# Patient Record
Sex: Male | Born: 1955
Health system: Southern US, Community
[De-identification: ages and names within clinical notes are randomized; demographics above are authoritative.]

## PROBLEM LIST (undated history)

## (undated) DIAGNOSIS — I10 Essential (primary) hypertension: Secondary | ICD-10-CM

## (undated) HISTORY — DX: Essential (primary) hypertension: I10

## (undated) HISTORY — PX: FRACTURE SURGERY: SHX138

---

## 1998-06-02 ENCOUNTER — Emergency Department (HOSPITAL_COMMUNITY): Admission: EM | Admit: 1998-06-02 | Discharge: 1998-06-02 | Payer: Self-pay | Admitting: Emergency Medicine

## 1998-08-19 ENCOUNTER — Emergency Department (HOSPITAL_COMMUNITY): Admission: EM | Admit: 1998-08-19 | Discharge: 1998-08-19 | Payer: Self-pay | Admitting: Emergency Medicine

## 1999-01-11 ENCOUNTER — Emergency Department (HOSPITAL_COMMUNITY): Admission: EM | Admit: 1999-01-11 | Discharge: 1999-01-11 | Payer: Self-pay | Admitting: Emergency Medicine

## 1999-01-11 ENCOUNTER — Encounter: Payer: Self-pay | Admitting: Emergency Medicine

## 1999-05-26 ENCOUNTER — Emergency Department (HOSPITAL_COMMUNITY): Admission: EM | Admit: 1999-05-26 | Discharge: 1999-05-26 | Payer: Self-pay | Admitting: Emergency Medicine

## 2000-10-22 ENCOUNTER — Emergency Department (HOSPITAL_COMMUNITY): Admission: EM | Admit: 2000-10-22 | Discharge: 2000-10-22 | Payer: Self-pay | Admitting: Emergency Medicine

## 2000-11-28 ENCOUNTER — Emergency Department (HOSPITAL_COMMUNITY): Admission: EM | Admit: 2000-11-28 | Discharge: 2000-11-28 | Payer: Self-pay | Admitting: *Deleted

## 2001-06-11 ENCOUNTER — Inpatient Hospital Stay (HOSPITAL_COMMUNITY): Admission: AC | Admit: 2001-06-11 | Discharge: 2001-06-25 | Payer: Self-pay

## 2004-04-20 ENCOUNTER — Emergency Department (HOSPITAL_COMMUNITY): Admission: EM | Admit: 2004-04-20 | Discharge: 2004-04-20 | Payer: Self-pay | Admitting: Emergency Medicine

## 2004-07-16 ENCOUNTER — Encounter: Admission: RE | Admit: 2004-07-16 | Discharge: 2004-07-16 | Payer: Self-pay | Admitting: Cardiology

## 2006-09-21 ENCOUNTER — Ambulatory Visit (HOSPITAL_BASED_OUTPATIENT_CLINIC_OR_DEPARTMENT_OTHER): Admission: RE | Admit: 2006-09-21 | Discharge: 2006-09-21 | Payer: Self-pay | Admitting: Cardiology

## 2006-09-25 ENCOUNTER — Ambulatory Visit: Payer: Self-pay | Admitting: Internal Medicine

## 2007-12-11 ENCOUNTER — Emergency Department (HOSPITAL_COMMUNITY): Admission: EM | Admit: 2007-12-11 | Discharge: 2007-12-11 | Payer: Self-pay | Admitting: Emergency Medicine

## 2011-02-18 ENCOUNTER — Emergency Department (HOSPITAL_COMMUNITY)
Admission: EM | Admit: 2011-02-18 | Discharge: 2011-02-19 | Disposition: A | Discharge status: Home / Self Care | Payer: 59 | Attending: Emergency Medicine | Admitting: Emergency Medicine

## 2011-02-18 ENCOUNTER — Emergency Department (HOSPITAL_COMMUNITY): Payer: 59

## 2011-02-18 DIAGNOSIS — R109 Unspecified abdominal pain: Secondary | ICD-10-CM | POA: Insufficient documentation

## 2011-02-18 DIAGNOSIS — M549 Dorsalgia, unspecified: Secondary | ICD-10-CM | POA: Insufficient documentation

## 2011-02-18 LAB — URINALYSIS, ROUTINE W REFLEX MICROSCOPIC
Bilirubin Urine: NEGATIVE
Glucose, UA: NEGATIVE mg/dL
Hgb urine dipstick: NEGATIVE
Ketones, ur: NEGATIVE mg/dL
Nitrite: NEGATIVE
Protein, ur: NEGATIVE mg/dL
Specific Gravity, Urine: 1.029 (ref 1.005–1.030)
Urobilinogen, UA: 0.2 mg/dL (ref 0.0–1.0)
pH: 5.5 (ref 5.0–8.0)

## 2011-02-18 LAB — DIFFERENTIAL
Basophils Absolute: 0.1 K/uL (ref 0.0–0.1)
Basophils Relative: 1 % (ref 0–1)
Eosinophils Absolute: 0.3 10*3/uL (ref 0.0–0.7)
Eosinophils Relative: 5 % (ref 0–5)
Lymphocytes Relative: 47 % — ABNORMAL HIGH (ref 12–46)
Lymphs Abs: 2.2 10*3/uL (ref 0.7–4.0)
Monocytes Absolute: 0.3 K/uL (ref 0.1–1.0)
Monocytes Relative: 6 % (ref 3–12)
Neutro Abs: 2.1 10*3/uL (ref 1.7–7.7)
Neutrophils Relative %: 41 % — ABNORMAL LOW (ref 43–77)

## 2011-02-18 LAB — CBC
HCT: 39.7 % (ref 39.0–52.0)
Hemoglobin: 12.3 g/dL — ABNORMAL LOW (ref 13.0–17.0)
MCH: 24.3 pg — ABNORMAL LOW (ref 26.0–34.0)
MCHC: 31 g/dL (ref 30.0–36.0)
MCV: 78.3 fL (ref 78.0–100.0)
RBC: 5.07 MIL/uL (ref 4.22–5.81)
RDW: 14.2 % (ref 11.5–15.5)
WBC: 5 K/uL (ref 4.0–10.5)

## 2011-02-18 LAB — BASIC METABOLIC PANEL
CO2: 23 mEq/L (ref 19–32)
Calcium: 9.4 mg/dL (ref 8.4–10.5)
Chloride: 105 mEq/L (ref 96–112)
Creatinine, Ser: 0.73 mg/dL (ref 0.4–1.5)
GFR calc non Af Amer: >60 mL/min (ref >60)
Glucose, Bld: 102 mg/dL — ABNORMAL HIGH (ref 70–99)
Potassium: 4.6 mEq/L (ref 3.5–5.1)
Sodium: 139 mEq/L (ref 135–145)

## 2011-02-18 LAB — BASIC METABOLIC PANEL WITH GFR
BUN: 15 mg/dL (ref 6–23)
GFR calc Af Amer: >60 mL/min (ref >60)
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
## 2011-04-09 NOTE — Procedures (Signed)
NAME:  ESTER, MABE NO.:  [id number]   MEDICAL RECORD NO.:  [id number]          PATIENT TYPE:  OUT   LOCATION:  SLEEP CENTER                 FACILITY:  [facility]   PHYSICIAN:  Clinton D. Maple Hudson, MD, FCCP, FACPDATE OF BIRTH:  01/18/58   DATE OF STUDY:  09/21/2006                              NOCTURNAL POLYSOMNOGRAM   INDICATION FOR STUDY:  Hypersomnia with sleep apnea.   EPWORTH SLEEPINESS SCORE:  13/24; BMI 29.8; weight 186 pounds.   MEDICATIONS:  Home medication limited to Lamisil.   SLEEP ARCHITECTURE:  Total sleep time 342 minutes with sleep efficiency 73%.  Stage I was 12%, stage II 82%, stages III and IV absent.  REM 6% of total  sleep time.  Sleep latency was 30 minutes, REM latency 109 minutes.  Awake  after sleep onset 96%.  Arousal index 15.1.  No bedtime medication was  taken.   RESPIRATORY DATA:  Apnea-hypopnea index (AHI, RDI):  Seventeen obstructive  events per hour, indicating moderate obstructive sleep apnea/hypopnea  syndrome.  There were 55 obstructive apneas and 42 hypopneas.  Events were  not positional.  REM AHI 75.3 per hour.  There were insufficient events  early in the night to qualify for CPAP titration by split study protocol on  this study night.   OXYGEN DATA:  Mild to moderate snoring with oxygen desaturation to a nadir  of 85%.  Mean oxygen saturation through the study was 94% on room air.   CARDIAC DATA:  Normal sinus rhythm.   MOVEMENT/PARASOMNIA:  Occasional limb jerk with arousal, insignificant.  No  bathroom trips.   IMPRESSIONS/RECOMMENDATIONS:  1. Moderate obstructive sleep apnea/hypopnea syndrome, apnea-hypopnea      index 17 per hour with non-positional events, mild to moderate snoring      and oxygen desaturation to a nadir of 85%.  2. Consider return for continuous positive airway pressure titration if      more conservative measures are insufficient; otherwise, consider      alternative therapies as  appropriate.      Clinton D. Maple Hudson, MD, [facility], FACP  Diplomate, Biomedical engineer of Sleep Medicine  Electronically Signed    CDY/MEDQ  D:  09/25/2006 10:19:11  T:  09/26/2006 07:49:22  Job:  [id number]
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
## 2011-04-09 NOTE — Op Note (Signed)
[place]. [facility]  Patient:    Matthew Hill, Matthew Hill                     MRN: [id number] Proc. Date: 06/19/01 Adm. Date:  [id number] Disc. Date: [id number] Attending:  Cain Sieve                           Operative Report  PREOPERATIVE DIAGNOSIS:  Full-thickness skin defect left popliteal fossa.  POSTOPERATIVE DIAGNOSIS:  Full-thickness skin defect left popliteal fossa.  PROCEDURE:  Debridement and split-thickness skin graft to left leg wound from left thigh.  SURGEON:  Dineen Kid. Best, M.D.  ANESTHESIA:  General.  COMPLICATIONS:  None.  DRAINS:  None.  TECHNIQUE:  The patient was positioned supine on the operating table and general endotracheal anesthesia was obtained.  A sterile prep and drape was performed.  The area of the anterior thigh was draped for a skin graft donor site.  The Zimmer mesher was used to remove a split-thickness skin graft from this position with a thickness setting of approximately 13 to 15 thousandths. The donor site was initially covered with Xylocaine with epinephrine soaked gauze sponge.  This was removed and a large piece of OpSite placed over the donor site area.  The patient was then turned to the right lateral decubitus position and prep and drape of the left posterior popliteal fossa of the knee was performed.  The graft was placed through the Zimmer mesher at a mesh ratio of 6:1 to expand it.  This was then expanded and placed over the skin defect and secured with multiple peripheral sutures of 5-0 chromic.  Xeroform gauze was placed over the graft and fluffed saline gauze placed over this and secured with an Ace wrap and Kerlix also.  The patient tolerated the procedure well and was sent to the recovery area in stable condition. DD:  06/19/01 TD:  06/19/01 Job: [id number]

## 2011-08-12 LAB — INFLUENZA A+B VIRUS AG-DIRECT(RAPID)

## 2011-08-12 LAB — RAPID STREP SCREEN (MED CTR MEBANE ONLY): Streptococcus, Group A Screen (Direct): NEGATIVE
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
## 2017-07-14 ENCOUNTER — Ambulatory Visit (INDEPENDENT_AMBULATORY_CARE_PROVIDER_SITE_OTHER): Payer: BLUE CROSS/BLUE SHIELD | Admitting: Physician Assistant

## 2017-07-14 ENCOUNTER — Encounter: Payer: Self-pay | Admitting: Physician Assistant

## 2017-07-14 VITALS — BP 142/94 | HR 84 | Temp 98.8°F | Resp 18 | Ht 67.24 in | Wt 195.4 lb

## 2017-07-14 DIAGNOSIS — I10 Essential (primary) hypertension: Secondary | ICD-10-CM | POA: Insufficient documentation

## 2017-07-14 MED ORDER — AMLODIPINE BESYLATE 5 MG PO TABS: 5.0000 mg | ORAL_TABLET | Freq: Every day | ORAL | 1 refills | Status: DC

## 2017-07-14 NOTE — Progress Notes (Signed)
PRIMARY CARE AT [facility] [address] [place] [address] [phone]  Date:  07/14/2017   Name:  Matthew Hill   DOB:  22-Dec-1955   MRN:  [id number]  PCP:  Patient, No Pcp Per    History of Present Illness:  Matthew Hill is a 61 y.o. male patient who presents to PCP with  Chief Complaint  Patient presents with  . Hypertension     No chest pains, palpitations, or sob.  He works out 3 times per week.  No salt in foods.  He has no primary care physician at this time.   Family hx: High blood pressure with mgm, mother, and sister.   No dizziness.   He has fried foods which he cooks with grapeseed oil or olive oil.   He does not wake up with gasping for air.    Wt Readings from Last 3 Encounters:  07/14/17 195 lb 6.4 oz (88.6 kg)     There are no active problems to display for this patient.   History reviewed. No pertinent past medical history.  History reviewed. No pertinent surgical history.  Social History  Substance Use Topics  . Smoking status: Never Smoker  . Smokeless tobacco: Never Used  . Alcohol use Yes    Family History  Problem Relation Age of Onset  . Stroke Mother   . Stroke Maternal Grandmother     No Known Allergies  Medication list has been reviewed and updated.  No current outpatient prescriptions on file prior to visit.   No current facility-administered medications on file prior to visit.     ROS ROS otherwise unremarkable unless listed above.  Physical Examination: BP (!) 142/94 (BP Location: Right Arm, Patient Position: Sitting, Cuff Size: Large)   Pulse 84   Temp 98.8 F (37.1 C) (Oral)   Resp 18   Ht 5' 7.24" (1.708 m)   Wt 195 lb 6.4 oz (88.6 kg)   SpO2 96%   BMI 30.38 kg/m  Ideal Body Weight: Weight in (lb) to have BMI = 25: 160.4  Physical Exam  Constitutional: He is oriented to person, place, and time. He appears well-developed and well-nourished. No distress.  HENT:  Head: Normocephalic and atraumatic.   Eyes: Pupils are equal, round, and reactive to light. Conjunctivae and EOM are normal.  Cardiovascular: Normal rate, regular rhythm, normal heart sounds and intact distal pulses.  Exam reveals no friction rub.   No murmur heard. Pulmonary/Chest: Effort normal and breath sounds normal. No respiratory distress. He has no wheezes.  Neurological: He is alert and oriented to person, place, and time.  Skin: Skin is warm and dry. He is not diaphoretic.  Psychiatric: He has a normal mood and affect. His behavior is normal.     Assessment and Plan: Matthew Hill is a 61 y.o. male who is here today for  Chief Complaint  Patient presents with  . Hypertension   Essential hypertension - Plan: amLODipine (NORVASC) 5 MG tablet, Basic metabolic panel  Trena Platt, PA-C [facility] 9/1/201810:06 AM

## 2017-07-14 NOTE — Patient Instructions (Addendum)
 DASH Eating Plan DASH stands for "Dietary Approaches to Stop Hypertension." The DASH eating plan is a healthy eating plan that has been shown to reduce high blood pressure (hypertension). It may also reduce your risk for type 2 diabetes, heart disease, and stroke. The DASH eating plan may also help with weight loss. What are tips for following this plan? General guidelines  Avoid eating more than 2,300 mg (milligrams) of salt (sodium) a day. If you have hypertension, you may need to reduce your sodium intake to 1,500 mg a day.  Limit alcohol intake to no more than 1 drink a day for nonpregnant women and 2 drinks a day for men. One drink equals 12 oz of beer, 5 oz of wine, or 1 oz of hard liquor.  Work with your health care provider to maintain a healthy body weight or to lose weight. Ask what an ideal weight is for you.  Get at least 30 minutes of exercise that causes your heart to beat faster (aerobic exercise) most days of the week. Activities may include walking, swimming, or biking.  Work with your health care provider or diet and nutrition specialist (dietitian) to adjust your eating plan to your individual calorie needs. Reading food labels  Check food labels for the amount of sodium per serving. Choose foods with less than 5 percent of the Daily Value of sodium. Generally, foods with less than 300 mg of sodium per serving fit into this eating plan.  To find whole grains, look for the word "whole" as the first word in the ingredient list. Shopping  Buy products labeled as "low-sodium" or "no salt added."  Buy fresh foods. Avoid canned foods and premade or frozen meals. Cooking  Avoid adding salt when cooking. Use salt-free seasonings or herbs instead of table salt or sea salt. Check with your health care provider or pharmacist before using salt substitutes.  Do not fry foods. Cook foods using healthy methods such as baking, boiling, grilling, and broiling instead.  Cook with  heart-healthy oils, such as olive, canola, soybean, or sunflower oil. Meal planning   Eat a balanced diet that includes: ? 5 or more servings of fruits and vegetables each day. At each meal, try to fill half of your plate with fruits and vegetables. ? Up to 6-8 servings of whole grains each day. ? Less than 6 oz of lean meat, poultry, or fish each day. A 3-oz serving of meat is about the same size as a deck of cards. One egg equals 1 oz. ? 2 servings of low-fat dairy each day. ? A serving of nuts, seeds, or beans 5 times each week. ? Heart-healthy fats. Healthy fats called Omega-3 fatty acids are found in foods such as flaxseeds and coldwater fish, like sardines, salmon, and mackerel.  Limit how much you eat of the following: ? Canned or prepackaged foods. ? Food that is high in trans fat, such as fried foods. ? Food that is high in saturated fat, such as fatty meat. ? Sweets, desserts, sugary drinks, and other foods with added sugar. ? Full-fat dairy products.  Do not salt foods before eating.  Try to eat at least 2 vegetarian meals each week.  Eat more home-cooked food and less restaurant, buffet, and fast food.  When eating at a restaurant, ask that your food be prepared with less salt or no salt, if possible. What foods are recommended? The items listed may not be a complete list. Talk with your dietitian about   what dietary choices are best for you. Grains Whole-grain or whole-wheat bread. Whole-grain or whole-wheat pasta. Brown rice. Oatmeal. Quinoa. Bulgur. Whole-grain and low-sodium cereals. Pita bread. Low-fat, low-sodium crackers. Whole-wheat flour tortillas. Vegetables Fresh or frozen vegetables (raw, steamed, roasted, or grilled). Low-sodium or reduced-sodium tomato and vegetable juice. Low-sodium or reduced-sodium tomato sauce and tomato paste. Low-sodium or reduced-sodium canned vegetables. Fruits All fresh, dried, or frozen fruit. Canned fruit in natural juice (without  added sugar). Meat and other protein foods Skinless chicken or turkey. Ground chicken or turkey. Pork with fat trimmed off. Fish and seafood. Egg whites. Dried beans, peas, or lentils. Unsalted nuts, nut butters, and seeds. Unsalted canned beans. Lean cuts of beef with fat trimmed off. Low-sodium, lean deli meat. Dairy Low-fat (1%) or fat-free (skim) milk. Fat-free, low-fat, or reduced-fat cheeses. Nonfat, low-sodium ricotta or cottage cheese. Low-fat or nonfat yogurt. Low-fat, low-sodium cheese. Fats and oils Soft margarine without trans fats. Vegetable oil. Low-fat, reduced-fat, or light mayonnaise and salad dressings (reduced-sodium). Canola, safflower, olive, soybean, and sunflower oils. Avocado. Seasoning and other foods Herbs. Spices. Seasoning mixes without salt. Unsalted popcorn and pretzels. Fat-free sweets. What foods are not recommended? The items listed may not be a complete list. Talk with your dietitian about what dietary choices are best for you. Grains Baked goods made with fat, such as croissants, muffins, or some breads. Dry pasta or rice meal packs. Vegetables Creamed or fried vegetables. Vegetables in a cheese sauce. Regular canned vegetables (not low-sodium or reduced-sodium). Regular canned tomato sauce and paste (not low-sodium or reduced-sodium). Regular tomato and vegetable juice (not low-sodium or reduced-sodium). Pickles. Olives. Fruits Canned fruit in a light or heavy syrup. Fried fruit. Fruit in cream or butter sauce. Meat and other protein foods Fatty cuts of meat. Ribs. Fried meat. Bacon. Sausage. Bologna and other processed lunch meats. Salami. Fatback. Hotdogs. Bratwurst. Salted nuts and seeds. Canned beans with added salt. Canned or smoked fish. Whole eggs or egg yolks. Chicken or turkey with skin. Dairy Whole or 2% milk, cream, and half-and-half. Whole or full-fat cream cheese. Whole-fat or sweetened yogurt. Full-fat cheese. Nondairy creamers. Whipped toppings.  Processed cheese and cheese spreads. Fats and oils Butter. Stick margarine. Lard. Shortening. Ghee. Bacon fat. Tropical oils, such as coconut, palm kernel, or palm oil. Seasoning and other foods Salted popcorn and pretzels. Onion salt, garlic salt, seasoned salt, table salt, and sea salt. Worcestershire sauce. Tartar sauce. Barbecue sauce. Teriyaki sauce. Soy sauce, including reduced-sodium. Steak sauce. Canned and packaged gravies. Fish sauce. Oyster sauce. Cocktail sauce. Horseradish that you find on the shelf. Ketchup. Mustard. Meat flavorings and tenderizers. Bouillon cubes. Hot sauce and Tabasco sauce. Premade or packaged marinades. Premade or packaged taco seasonings. Relishes. Regular salad dressings. Where to find more information:  National Heart, Lung, and Blood Institute: www.nhlbi.nih.gov  American Heart Association: www.heart.org Summary  The DASH eating plan is a healthy eating plan that has been shown to reduce high blood pressure (hypertension). It may also reduce your risk for type 2 diabetes, heart disease, and stroke.  With the DASH eating plan, you should limit salt (sodium) intake to 2,300 mg a day. If you have hypertension, you may need to reduce your sodium intake to 1,500 mg a day.  When on the DASH eating plan, aim to eat more fresh fruits and vegetables, whole grains, lean proteins, low-fat dairy, and heart-healthy fats.  Work with your health care provider or diet and nutrition specialist (dietitian) to adjust your eating plan to your   individual calorie needs. This information is not intended to replace advice given to you by your health care provider. Make sure you discuss any questions you have with your health care provider. Document Released: 10/28/2011 Document Revised: 11/01/2016 Document Reviewed: 11/01/2016 Elsevier Interactive Patient Education  2017 Elsevier Inc.    IF you received an x-ray today, you will receive an invoice from [facility].  Please contact [facility] at [phone] with questions or concerns regarding your invoice.   IF you received labwork today, you will receive an invoice from [facility]. Please contact [facility] at [phone] with questions or concerns regarding your invoice.   Our billing staff will not be able to assist you with questions regarding bills from these companies.  You will be contacted with the lab results as soon as they are available. The fastest way to get your results is to activate your My Chart account. Instructions are located on the last page of this paperwork. If you have not heard from us regarding the results in 2 weeks, please contact this office.     

## 2017-07-15 LAB — BASIC METABOLIC PANEL
BUN/Creatinine Ratio: 15 (ref 9–20)
BUN: 18 mg/dL (ref 6–24)
CALCIUM: 9.8 mg/dL (ref 8.7–10.2)
CO2: 20 mmol/L (ref 20–29)
CREATININE: 1.22 mg/dL (ref 0.76–1.27)
Chloride: 101 mmol/L (ref 96–106)
GFR, EST AFRICAN AMERICAN: 75 mL/min/{1.73_m2} (ref >59)
GFR, EST NON AFRICAN AMERICAN: 64 mL/min/{1.73_m2} (ref >59)
Glucose: 100 mg/dL — ABNORMAL HIGH (ref 65–99)
Potassium: 4.6 mmol/L (ref 3.5–5.2)
Sodium: 140 mmol/L (ref 134–144)

## 2017-08-01 ENCOUNTER — Encounter: Payer: Self-pay | Admitting: Physician Assistant

## 2017-08-01 ENCOUNTER — Ambulatory Visit (INDEPENDENT_AMBULATORY_CARE_PROVIDER_SITE_OTHER): Payer: BLUE CROSS/BLUE SHIELD | Admitting: Physician Assistant

## 2017-08-01 ENCOUNTER — Ambulatory Visit: Payer: BLUE CROSS/BLUE SHIELD | Admitting: Physician Assistant

## 2017-08-01 VITALS — BP 128/98 | HR 74 | Temp 97.9°F | Resp 18 | Ht 67.24 in | Wt 197.0 lb

## 2017-08-01 DIAGNOSIS — I10 Essential (primary) hypertension: Secondary | ICD-10-CM | POA: Diagnosis not present

## 2017-08-01 NOTE — Progress Notes (Signed)
[facility] [address], [place] [id number] [phone]  Date:  08/01/2017   Name:  Matthew Hill   DOB:  [birth year]   MRN:  [id number]  PCP:  Patient, No Pcp Per    History of Present Illness:  Matthew Hill is a 61 y.o. male patient who presents to PCP with  Chief Complaint  Patient presents with  . Hypertension    follow up      Patient continues to participate in the same exercise that he has been doing at this time.   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    There are no active problems to display for this patient.   No past medical history on file.  No past surgical history on file.  Social History  Substance Use Topics  . Smoking status: Never Smoker  . Smokeless tobacco: Never Used  . Alcohol use Yes    Family History  Problem Relation Age of Onset  . Stroke Mother   . Stroke Maternal Grandmother     No Known Allergies  Medication list has been reviewed and updated.  Current Outpatient Prescriptions on File Prior to Visit  Medication Sig Dispense Refill  . amLODipine (NORVASC) 5 MG tablet Take 1 tablet (5 mg total) by mouth daily. 30 tablet 1   No current facility-administered medications on file prior to visit.     ROS ROS otherwise unremarkable unless listed above.  Physical Examination: BP (!) 128/98   Pulse 74   Temp 97.9 F (36.6 C) (Oral)   Resp 18   Ht 5' 7.24" (1.708 m)   Wt 197 lb (89.4 kg)   SpO2 98%   BMI 30.63 kg/m  Ideal Body Weight: Weight in (lb) to have BMI = 25: 160.4  Physical Exam  Constitutional: He is oriented to person, place, and time. He appears well-developed and well-nourished. No distress.  HENT:  Head: Normocephalic and atraumatic.  Eyes: Pupils are equal, round, and reactive to light. Conjunctivae and EOM are normal.  Cardiovascular: Normal rate, regular rhythm and normal  heart sounds.  Exam reveals no friction rub.   No murmur heard. Pulmonary/Chest: Effort normal. No respiratory distress.  Musculoskeletal: He exhibits no edema.  Neurological: He is alert and oriented to person, place, and time.  Skin: Skin is warm and dry. He is not diaphoretic.  Psychiatric: He has a normal mood and affect. His behavior is normal.     Assessment and Plan: Matthew Hill is a 61 y.o. male who is here today for follow up of BP. This is improving. I am having him return in 1-2 months for full physical exam where we can update health maintenance and screenings.  He appears willing and wanting to have this. Essential hypertension  Trena PlattStephanie English, PA-C [facility] 9/12/20184:27 PM

## 2017-08-01 NOTE — Patient Instructions (Signed)
     IF you received an x-ray today, you will receive an invoice from [facility]. Please contact [facility] at [phone] with questions or concerns regarding your invoice.   IF you received labwork today, you will receive an invoice from [facility]. Please contact [facility] at [phone] with questions or concerns regarding your invoice.   Our billing staff will not be able to assist you with questions regarding bills from these companies.  You will be contacted with the lab results as soon as they are available. The fastest way to get your results is to activate your My Chart account. Instructions are located on the last page of this paperwork. If you have not heard from us regarding the results in 2 weeks, please contact this office.     

## 2017-08-08 ENCOUNTER — Encounter: Payer: Self-pay | Admitting: *Deleted

## 2017-10-03 ENCOUNTER — Ambulatory Visit: Payer: BLUE CROSS/BLUE SHIELD | Admitting: Physician Assistant

## 2018-02-20 ENCOUNTER — Encounter: Payer: Self-pay | Admitting: Physician Assistant

## 2019-09-10 ENCOUNTER — Encounter: Payer: Self-pay | Admitting: Registered Nurse

## 2019-09-10 ENCOUNTER — Encounter: Payer: BLUE CROSS/BLUE SHIELD | Admitting: Registered Nurse

## 2019-09-11 ENCOUNTER — Encounter: Payer: Self-pay | Admitting: Registered Nurse

## 2019-09-11 ENCOUNTER — Ambulatory Visit (INDEPENDENT_AMBULATORY_CARE_PROVIDER_SITE_OTHER): Payer: 59 | Admitting: Registered Nurse

## 2019-09-11 ENCOUNTER — Other Ambulatory Visit: Payer: Self-pay

## 2019-09-11 VITALS — BP 142/100 | HR 59 | Temp 97.6°F | Resp 16 | Ht 67.52 in | Wt 187.0 lb

## 2019-09-11 DIAGNOSIS — Z7689 Persons encountering health services in other specified circumstances: Secondary | ICD-10-CM | POA: Diagnosis not present

## 2019-09-11 DIAGNOSIS — Z13228 Encounter for screening for other metabolic disorders: Secondary | ICD-10-CM

## 2019-09-11 DIAGNOSIS — I10 Essential (primary) hypertension: Secondary | ICD-10-CM

## 2019-09-11 DIAGNOSIS — Z13220 Encounter for screening for lipoid disorders: Secondary | ICD-10-CM

## 2019-09-11 DIAGNOSIS — Z1329 Encounter for screening for other suspected endocrine disorder: Secondary | ICD-10-CM

## 2019-09-11 DIAGNOSIS — Z130 Encounter for screening for diseases of the blood and blood-forming organs and certain disorders involving the immune mechanism: Secondary | ICD-10-CM

## 2019-09-11 DIAGNOSIS — K439 Ventral hernia without obstruction or gangrene: Secondary | ICD-10-CM

## 2019-09-11 MED ORDER — AMLODIPINE BESYLATE 5 MG PO TABS: 5.0000 mg | ORAL_TABLET | Freq: Every day | ORAL | 3 refills | Status: AC

## 2019-09-11 NOTE — Patient Instructions (Signed)
° ° ° °  If you have lab work done today you will be contacted with your lab results within the next 2 weeks.  If you have not heard from us then please contact us. The fastest way to get your results is to register for My Chart. ° ° °IF you received an x-ray today, you will receive an invoice from [facility]. Please contact [facility] at [phone] with questions or concerns regarding your invoice.  ° °IF you received labwork today, you will receive an invoice from [facility]. Please contact [facility] at [phone] with questions or concerns regarding your invoice.  ° °Our billing staff will not be able to assist you with questions regarding bills from these companies. ° °You will be contacted with the lab results as soon as they are available. The fastest way to get your results is to activate your My Chart account. Instructions are located on the last page of this paperwork. If you have not heard from us regarding the results in 2 weeks, please contact this office. °  ° ° ° °

## 2019-09-12 LAB — CBC WITH DIFFERENTIAL/PLATELET
Basophils Absolute: 0 10*3/uL (ref 0.0–0.2)
Basos: 1 %
EOS (ABSOLUTE): 0.3 10*3/uL (ref 0.0–0.4)
Eos: 4 %
Hematocrit: 44.6 % (ref 37.5–51.0)
Hemoglobin: 14 g/dL (ref 13.0–17.7)
Immature Grans (Abs): 0 10*3/uL (ref 0.0–0.1)
Immature Granulocytes: 0 %
Lymphocytes Absolute: 2.4 10*3/uL (ref 0.7–3.1)
Lymphs: 29 %
MCH: 23.8 pg — ABNORMAL LOW (ref 26.6–33.0)
MCHC: 31.4 g/dL — ABNORMAL LOW (ref 31.5–35.7)
MCV: 76 fL — ABNORMAL LOW (ref 79–97)
Monocytes Absolute: 0.6 10*3/uL (ref 0.1–0.9)
Monocytes: 7 %
Neutrophils Absolute: 4.8 10*3/uL (ref 1.4–7.0)
Neutrophils: 59 %
Platelets: 316 10*3/uL (ref 150–450)
RBC: 5.89 x10E6/uL — ABNORMAL HIGH (ref 4.14–5.80)
RDW: 15.6 % — ABNORMAL HIGH (ref 11.6–15.4)
WBC: 8.1 10*3/uL (ref 3.4–10.8)

## 2019-09-12 LAB — COMPREHENSIVE METABOLIC PANEL
ALT: 24 IU/L (ref 0–44)
AST: 25 IU/L (ref 0–40)
Albumin/Globulin Ratio: 1.8 (ref 1.2–2.2)
Albumin: 4.8 g/dL (ref 3.8–4.8)
Alkaline Phosphatase: 74 IU/L (ref 39–117)
BUN/Creatinine Ratio: 18 (ref 10–24)
BUN: 17 mg/dL (ref 8–27)
Bilirubin Total: <0.2 mg/dL (ref 0.0–1.2)
CO2: 23 mmol/L (ref 20–29)
Calcium: 10.1 mg/dL (ref 8.6–10.2)
Chloride: 107 mmol/L — ABNORMAL HIGH (ref 96–106)
Creatinine, Ser: 0.92 mg/dL (ref 0.76–1.27)
GFR calc Af Amer: 103 mL/min/{1.73_m2} (ref >59)
GFR calc non Af Amer: 89 mL/min/{1.73_m2} (ref >59)
Globulin, Total: 2.6 g/dL (ref 1.5–4.5)
Glucose: 104 mg/dL — ABNORMAL HIGH (ref 65–99)
Potassium: 4.6 mmol/L (ref 3.5–5.2)
Sodium: 144 mmol/L (ref 134–144)
Total Protein: 7.4 g/dL (ref 6.0–8.5)

## 2019-09-12 LAB — HEMOGLOBIN A1C
Est. average glucose Bld gHb Est-mCnc: 126 mg/dL
Hgb A1c MFr Bld: 6 % — ABNORMAL HIGH (ref 4.8–5.6)

## 2019-09-12 LAB — LIPID PANEL
Chol/HDL Ratio: 4.1 ratio (ref 0.0–5.0)
Cholesterol, Total: 209 mg/dL — ABNORMAL HIGH (ref 100–199)
HDL: 51 mg/dL (ref >39)
LDL Chol Calc (NIH): 124 mg/dL — ABNORMAL HIGH (ref 0–99)
Triglycerides: 193 mg/dL — ABNORMAL HIGH (ref 0–149)
VLDL Cholesterol Cal: 34 mg/dL (ref 5–40)

## 2019-09-12 LAB — TSH: TSH: 1.62 u[IU]/mL (ref 0.450–4.500)

## 2019-09-13 ENCOUNTER — Encounter: Payer: Self-pay | Admitting: Registered Nurse

## 2019-09-13 NOTE — Progress Notes (Signed)
Established Patient Office Visit  Subjective:  Patient ID: Matthew Hill, male    DOB: 12/25/1955  Age: 63 y.o. MRN: [id number]  CC:  Chief Complaint  Patient presents with  . Breast Mass    pt states he has had this mass on his stomach for 2 months   . Hypertension    toc to manage medication     HPI Matthew Hill presents for visit to establish care and concern for abdominal mass. Given the relative simplicity of his medical history, we spent much of this visit focused on his abdominal mass.   Mass onset around 2 months ago. Has not changed much. Not painful or tender. Denies constipation, diarrhea, nausea, vomiting, abdominal pain, fever, unexpected weight changes. He states he is still passing stool and gas very regularly. States his diet is largely plant based.  Otherwise, he has a hx of HTN for which he takes amlodipine 5mg  PO qd with good effect. He has run out, and has high BP today. Will return to clinic in 2 weeks for nurse visit BP check. If still high, we will double dose to 10mg  PO qd.   Past Medical History:  Diagnosis Date  . Essential hypertension     History reviewed. No pertinent surgical history.  Family History  Problem Relation Age of Onset  . Stroke Mother   . Stroke Father   . Heart attack Father   . Stroke Maternal Grandmother   . Stroke Sister   . Stroke Brother     Social History   Socioeconomic History  . Marital status: Single    Spouse name: Not on file  . Number of children: Not on file  . Years of education: Not on file  . Highest education level: Not on file  Occupational History  . Not on file  Social Needs  . Financial resource strain: Not hard at all  . Food insecurity    Worry: Never true    Inability: Never true  . Transportation needs    Medical: No    Non-medical: No  Tobacco Use  . Smoking status: Never Smoker  . Smokeless tobacco: Never Used  Substance and Sexual Activity  . Alcohol use: Yes  . Drug use: No   . Sexual activity: Not Currently  Lifestyle  . Physical activity    Days per week: 3 days    Minutes per session: 30 min  . Stress: Only a little  Relationships  . Social Musicianconnections    Talks on phone: Three times a week    Gets together: Twice a week    Attends religious service: Patient refused    Active member of club or organization: Patient refused    Attends meetings of clubs or organizations: Patient refused    Relationship status: Patient refused  . Intimate partner violence    Fear of current or ex partner: No    Emotionally abused: No    Physically abused: No    Forced sexual activity: No  Other Topics Concern  . Not on file  Social History Narrative  . Not on file    Outpatient Medications Prior to Visit  Medication Sig Dispense Refill  . amLODipine (NORVASC) 5 MG tablet Take 1 tablet (5 mg total) by mouth daily. 30 tablet 1   No facility-administered medications prior to visit.     No Known Allergies  ROS Review of Systems  Constitutional: Negative.  Negative for appetite change, fever and unexpected weight change.  HENT: Negative.   Eyes: Negative.   Respiratory: Negative.  Negative for shortness of breath.   Cardiovascular: Negative.  Negative for chest pain.  Gastrointestinal: Positive for abdominal distention. Negative for abdominal pain, anal bleeding, blood in stool, constipation, diarrhea, nausea, rectal pain and vomiting.  Endocrine: Negative.   Genitourinary: Negative.  Negative for difficulty urinating and dysuria.  Musculoskeletal: Negative.   Skin: Negative.   Allergic/Immunologic: Negative.   Neurological: Negative.   Hematological: Negative.   Psychiatric/Behavioral: Negative.   All other systems reviewed and are negative.     Objective:    Physical Exam  Constitutional: He is oriented to person, place, and time. He appears well-developed and well-nourished. No distress.  HENT:  Head: Normocephalic and atraumatic.  Right Ear:  External ear normal.  Left Ear: External ear normal.  Nose: Nose normal.  Mouth/Throat: Oropharynx is clear and moist. No oropharyngeal exudate.  Eyes: Pupils are equal, round, and reactive to light. Conjunctivae and EOM are normal. Right eye exhibits no discharge. Left eye exhibits no discharge. No scleral icterus.  Neck: Normal range of motion. Neck supple. No tracheal deviation present. No thyromegaly present.  Cardiovascular: Normal rate, regular rhythm, normal heart sounds and intact distal pulses. Exam reveals no gallop and no friction rub.  No murmur heard. Pulmonary/Chest: Effort normal and breath sounds normal. No respiratory distress.  Abdominal: Soft. Bowel sounds are normal. He exhibits distension and mass. He exhibits no pulsatile midline mass. There is no hepatosplenomegaly, splenomegaly or hepatomegaly. There is no abdominal tenderness. There is no rebound, no guarding and no CVA tenderness. A hernia is present. Hernia confirmed positive in the ventral area. Hernia confirmed negative in the right inguinal area and confirmed negative in the left inguinal area.    Musculoskeletal: Normal range of motion.  Lymphadenopathy:    He has no cervical adenopathy.  Neurological: He is alert and oriented to person, place, and time.  Skin: Skin is warm and dry. No rash noted. He is not diaphoretic. No erythema. No pallor.  Psychiatric: He has a normal mood and affect. His behavior is normal. Judgment and thought content normal.  Vitals reviewed.   BP (!) 142/100   Pulse (!) 59   Temp 97.6 F (36.4 C) (Oral)   Resp 16   Ht 5' 7.52" (1.715 m)   Wt 187 lb (84.8 kg)   SpO2 100%   BMI 28.84 kg/m  Wt Readings from Last 3 Encounters:  09/11/19 187 lb (84.8 kg)  08/01/17 197 lb (89.4 kg)  07/14/17 195 lb 6.4 oz (88.6 kg)     Health Maintenance Due  Topic Date Due  . Hepatitis C Screening  01-26-1956  . HIV Screening  01/19/1973  . TETANUS/TDAP  01/19/1977  . COLONOSCOPY   01/20/2008  . INFLUENZA VACCINE  06/23/2019    There are no preventive care reminders to display for this patient.  Lab Results  Component Value Date   TSH 1.620 09/11/2019   Lab Results  Component Value Date   WBC 8.1 09/11/2019   HGB 14.0 09/11/2019   HCT 44.6 09/11/2019   MCV 76 (L) 09/11/2019   PLT 316 09/11/2019   Lab Results  Component Value Date   NA 144 09/11/2019   K 4.6 09/11/2019   CO2 23 09/11/2019   GLUCOSE 104 (H) 09/11/2019   BUN 17 09/11/2019   CREATININE 0.92 09/11/2019   BILITOT <0.2 09/11/2019   ALKPHOS 74 09/11/2019   AST 25 09/11/2019   ALT 24 09/11/2019  PROT 7.4 09/11/2019   ALBUMIN 4.8 09/11/2019   CALCIUM 10.1 09/11/2019   Lab Results  Component Value Date   CHOL 209 (H) 09/11/2019   Lab Results  Component Value Date   HDL 51 09/11/2019   Lab Results  Component Value Date   LDLCALC 124 (H) 09/11/2019   Lab Results  Component Value Date   TRIG 193 (H) 09/11/2019   Lab Results  Component Value Date   CHOLHDL 4.1 09/11/2019   Lab Results  Component Value Date   HGBA1C 6.0 (H) 09/11/2019      Assessment & Plan:   Problem List Items Addressed This Visit      Cardiovascular and Mediastinum   Essential hypertension   Relevant Medications   amLODipine (NORVASC) 5 MG tablet   Other Relevant Orders   Comprehensive metabolic panel (Completed)   CBC with Differential (Completed)    Other Visit Diagnoses    Encounter to establish care    -  Primary   Screening for endocrine, metabolic and immunity disorder       Relevant Orders   Comprehensive metabolic panel (Completed)   Hemoglobin A1c (Completed)   TSH (Completed)   CBC with Differential (Completed)   Lipid screening       Relevant Orders   Lipid panel (Completed)   Ventral hernia without obstruction or gangrene       Relevant Orders   Ambulatory referral to Gastroenterology      Meds ordered this encounter  Medications  . amLODipine (NORVASC) 5 MG tablet     Sig: Take 1 tablet (5 mg total) by mouth daily.    Dispense:  90 tablet    Refill:  3    Order Specific Question:   Supervising Provider    Answer:   Forrest Moron [id number]    Follow-up: No follow-ups on file.   PLAN  Pt appears well overall.   Refill amlodipine, return in 2 weeks for BP check  Abdominal mass appears as ventral hernia - will refer to GI for management as patient wishes to discuss surgical options for repair.   Patient encouraged to call clinic with any questions, comments, or concerns.   Maximiano Coss, NP

## 2019-09-13 NOTE — Progress Notes (Signed)
Results returned Lipids and A1c elevated, encourage lifestyle modifications Check again in 1 year

## 2020-04-29 ENCOUNTER — Other Ambulatory Visit: Payer: Self-pay | Admitting: General Surgery

## 2020-04-30 ENCOUNTER — Other Ambulatory Visit: Payer: Self-pay | Admitting: General Surgery

## 2020-05-08 ENCOUNTER — Inpatient Hospital Stay
Admission: RE | Admit: 2020-05-08 | Discharge: 2020-05-08 | Disposition: A | Discharge status: Home / Self Care | Payer: 59 | Source: Ambulatory Visit

## 2020-05-08 NOTE — Pre-Procedure Instructions (Signed)
Unable to reach patient x 2, no voicemail set up. Spoke to [place] at [employer] office she was able to find an emergency contact for patient however it differs from the contact we have at the hospital. I did attempt to call both contacts, but they either did not identify themselves on their message or have a voicemail set up. Chart given to secretary to be added to another day.

## 2020-05-13 ENCOUNTER — Encounter
Admission: RE | Admit: 2020-05-13 | Discharge: 2020-05-13 | Disposition: A | Discharge status: Home / Self Care | Payer: 59 | Source: Ambulatory Visit | Attending: General Surgery | Admitting: General Surgery

## 2020-05-13 NOTE — Pre-Procedure Instructions (Signed)
Attempted to contact patient via phone for PAT interview. Unable to leave message for patient -mailbox not set up. Maureen at office said they have attempted to get in contact with pt but have not been successful. Pt to have covid test tomorrow. She suggest if he shows up to covid test try to get what we need then. We will contact office if he does not show up for covid test.

## 2020-05-14 ENCOUNTER — Other Ambulatory Visit
Admission: RE | Admit: 2020-05-14 | Discharge: 2020-05-14 | Disposition: A | Discharge status: Home / Self Care | Payer: 59 | Source: Ambulatory Visit | Attending: General Surgery | Admitting: General Surgery

## 2020-05-14 NOTE — Progress Notes (Signed)
No show for covid test today. Dr. Rutherford Nail office notified.

## 2020-05-15 ENCOUNTER — Encounter
Admission: RE | Admit: 2020-05-15 | Discharge: 2020-05-15 | Disposition: A | Discharge status: Home / Self Care | Payer: 59 | Source: Ambulatory Visit | Attending: General Surgery | Admitting: General Surgery

## 2020-05-15 ENCOUNTER — Other Ambulatory Visit
Admission: RE | Admit: 2020-05-15 | Discharge: 2020-05-15 | Disposition: A | Discharge status: Home / Self Care | Payer: 59 | Source: Ambulatory Visit | Attending: General Surgery | Admitting: General Surgery

## 2020-05-15 ENCOUNTER — Other Ambulatory Visit: Payer: Self-pay

## 2020-05-15 DIAGNOSIS — Z20822 Contact with and (suspected) exposure to covid-19: Secondary | ICD-10-CM | POA: Diagnosis not present

## 2020-05-15 DIAGNOSIS — Z01818 Encounter for other preprocedural examination: Secondary | ICD-10-CM | POA: Insufficient documentation

## 2020-05-15 DIAGNOSIS — I10 Essential (primary) hypertension: Secondary | ICD-10-CM | POA: Insufficient documentation

## 2020-05-15 LAB — SARS CORONAVIRUS 2 (TAT 6-24 HRS): SARS Coronavirus 2: NEGATIVE

## 2020-05-15 NOTE — Pre-Procedure Instructions (Signed)
B/P elevated for PAT visit.  PCP moved so he needs to get established with another MD.  Educated on need to get another PCP and seek better control of b/p.  Exercises regularly. B/p had been well controlled. Worked last night and hasn't slept yet.

## 2020-05-15 NOTE — Patient Instructions (Addendum)
Your procedure is scheduled on: tomorrow 6/25 Report to Day Surgery. To find out your arrival time please call [phone] between 1PM - 3PM on today 6/24.  Remember: Instructions that are not followed completely may result in serious medical risk,  up to and including death, or upon the discretion of your surgeon and anesthesiologist your  surgery may need to be rescheduled.     _X__ 1. Do not eat food after midnight the night before your procedure.                 No gum chewing or hard candies. You may drink clear liquids up to 2 hours                 before you are scheduled to arrive for your surgery- DO not drink clear                 liquids within 2 hours of the start of your surgery.                 Clear Liquids include:  water, apple juice without pulp, clear Gatorade, G2 or                  Gatorade Zero (avoid Red/Purple/Blue), Black Coffee or Tea (Do not add                 anything to coffee or tea). _____2.   Complete the carbohydrate drink provided to you, 2 hours before arrival.  __X__2.  On the morning of surgery brush your teeth with toothpaste and water, you                may rinse your mouth with mouthwash if you wish.  Do not swallow any toothpaste of mouthwash.     _X__ 3.  No Alcohol for 24 hours before or after surgery.   ___ 4.  Do Not Smoke or use e-cigarettes For 24 Hours Prior to Your Surgery.                 Do not use any chewable tobacco products for at least 6 hours prior to                 Surgery.  ___  5.  Do not use any recreational drugs (marijuana, cocaine, heroin, ecstacy, MDMA or other)                For at least one week prior to your surgery.  Combination of these drugs with anesthesia                May have life threatening results.  ____  6.  Bring all medications with you on the day of surgery if instructed.   __x__  7.  Notify your doctor if there is any change in your medical condition      (cold, fever,  infections).     Do not wear jewelry,. Do not wear lotions,  You may wear deodorant. Do not shave 48 hours prior to surgery. Men may shave face and neck. Do not bring valuables to the hospital.    [facility] is not responsible for any belongings or valuables.  Contacts, dentures or bridgework may not be worn into surgery. Leave your suitcase in the car. After surgery it may be brought to your room. For patients admitted to the hospital, discharge time is determined by your treatment team.   Patients discharged the day of  surgery will not be allowed to drive home.   Make arrangements for someone to be with you for the first 24 hours of your Same Day Discharge.    Please read over the following fact sheets that you were given:       __x__ Take these medicines the morning of surgery with A SIP OF WATER:    1.none  2.   3.   4.  5.  6.  ____ Fleet Enema (as directed)   __x__ Use CHG Soap (or wipes) as directed  ____ Use Benzoyl Peroxide Gel as instructed  ____ Use inhalers on the day of surgery  ____ Stop metformin 2 days prior to surgery    ____ Take 1/2 of usual insulin dose the night before surgery. No insulin the morning          of surgery.   ____ Stop Coumadin/Plavix/aspirin on  __x__ Stop Anti-inflammatories no ibuprofen aleve or aspirin until after surgery  May take tylenol   ____ Stop supplements until after surgery.    ____ Bring C-Pap to the hospital.

## 2020-05-16 ENCOUNTER — Ambulatory Visit
Admission: AD | Admit: 2020-05-16 | Discharge: 2020-05-16 | Disposition: A | Discharge status: Home / Self Care | Payer: 59 | Attending: General Surgery | Admitting: General Surgery

## 2020-05-16 ENCOUNTER — Other Ambulatory Visit: Payer: Self-pay

## 2020-05-16 ENCOUNTER — Ambulatory Visit: Payer: 59 | Admitting: Anesthesiology

## 2020-05-16 ENCOUNTER — Encounter
Admission: AD | Disposition: A | Discharge status: Home / Self Care | Payer: Self-pay | Source: Home / Self Care | Attending: General Surgery

## 2020-05-16 ENCOUNTER — Encounter: Payer: Self-pay | Admitting: General Surgery

## 2020-05-16 DIAGNOSIS — K436 Other and unspecified ventral hernia with obstruction, without gangrene: Secondary | ICD-10-CM | POA: Insufficient documentation

## 2020-05-16 DIAGNOSIS — I10 Essential (primary) hypertension: Secondary | ICD-10-CM | POA: Insufficient documentation

## 2020-05-16 DIAGNOSIS — Z79899 Other long term (current) drug therapy: Secondary | ICD-10-CM | POA: Diagnosis not present

## 2020-05-16 HISTORY — PX: EPIGASTRIC HERNIA REPAIR: SHX404

## 2020-05-16 SURGERY — REPAIR, HERNIA, EPIGASTRIC, ADULT
Anesthesia: General | Site: Abdomen

## 2020-05-16 MED ORDER — LACTATED RINGERS IV SOLN: INTRAVENOUS | Status: DC | PRN

## 2020-05-16 MED ORDER — LACTATED RINGERS IV SOLN: INTRAVENOUS | Status: DC

## 2020-05-16 MED ORDER — ACETAMINOPHEN 10 MG/ML IV SOLN
INTRAVENOUS | Status: AC
  Filled 2020-05-16: qty 100

## 2020-05-16 MED ORDER — FAMOTIDINE 20 MG PO TABS
ORAL_TABLET | ORAL | Status: AC
  Administered 2020-05-16: 20 mg via ORAL
  Filled 2020-05-16: qty 1

## 2020-05-16 MED ORDER — DEXAMETHASONE SODIUM PHOSPHATE 10 MG/ML IJ SOLN
INTRAMUSCULAR | Status: DC | PRN
  Administered 2020-05-16: 10 mg via INTRAVENOUS

## 2020-05-16 MED ORDER — FENTANYL CITRATE (PF) 100 MCG/2ML IJ SOLN
INTRAMUSCULAR | Status: AC
  Filled 2020-05-16: qty 2

## 2020-05-16 MED ORDER — HYDROCODONE-ACETAMINOPHEN 5-325 MG PO TABS
1.0000 | ORAL_TABLET | ORAL | 0 refills | Status: AC | PRN
Start: 2020-05-16 — End: 2021-05-16

## 2020-05-16 MED ORDER — FENTANYL CITRATE (PF) 100 MCG/2ML IJ SOLN: 25.0000 ug | INTRAMUSCULAR | Status: DC | PRN

## 2020-05-16 MED ORDER — FENTANYL CITRATE (PF) 100 MCG/2ML IJ SOLN
INTRAMUSCULAR | Status: DC | PRN
  Administered 2020-05-16 (×2): 50 ug via INTRAVENOUS

## 2020-05-16 MED ORDER — CEFAZOLIN SODIUM-DEXTROSE 2-4 GM/100ML-% IV SOLN
INTRAVENOUS | Status: AC
  Filled 2020-05-16: qty 100

## 2020-05-16 MED ORDER — CEFAZOLIN SODIUM-DEXTROSE 2-4 GM/100ML-% IV SOLN
2.0000 g | INTRAVENOUS | Status: AC
  Administered 2020-05-16: 2 g via INTRAVENOUS

## 2020-05-16 MED ORDER — ORAL CARE MOUTH RINSE: 15.0000 mL | Freq: Once | OROMUCOSAL | Status: AC

## 2020-05-16 MED ORDER — MIDAZOLAM HCL 2 MG/2ML IJ SOLN
INTRAMUSCULAR | Status: AC
  Filled 2020-05-16: qty 2

## 2020-05-16 MED ORDER — BUPIVACAINE HCL (PF) 0.5 % IJ SOLN
INTRAMUSCULAR | Status: AC
  Filled 2020-05-16: qty 30

## 2020-05-16 MED ORDER — ONDANSETRON HCL 4 MG/2ML IJ SOLN: 4.0000 mg | Freq: Once | INTRAMUSCULAR | Status: DC | PRN

## 2020-05-16 MED ORDER — OXYCODONE HCL 5 MG/5ML PO SOLN: 5.0000 mg | Freq: Once | ORAL | Status: DC | PRN

## 2020-05-16 MED ORDER — MIDAZOLAM HCL 2 MG/2ML IJ SOLN
INTRAMUSCULAR | Status: DC | PRN
  Administered 2020-05-16 (×2): 1 mg via INTRAVENOUS

## 2020-05-16 MED ORDER — DEXMEDETOMIDINE HCL 200 MCG/2ML IV SOLN
INTRAVENOUS | Status: DC | PRN
  Administered 2020-05-16: 16 ug via INTRAVENOUS

## 2020-05-16 MED ORDER — ACETAMINOPHEN 10 MG/ML IV SOLN: 1000.0000 mg | Freq: Once | INTRAVENOUS | Status: DC | PRN

## 2020-05-16 MED ORDER — FAMOTIDINE 20 MG PO TABS: 20.0000 mg | ORAL_TABLET | Freq: Once | ORAL | Status: AC

## 2020-05-16 MED ORDER — PROPOFOL 10 MG/ML IV BOLUS
INTRAVENOUS | Status: DC | PRN
  Administered 2020-05-16: 180 mg via INTRAVENOUS

## 2020-05-16 MED ORDER — BUPIVACAINE HCL 0.5 % IJ SOLN
INTRAMUSCULAR | Status: DC | PRN
  Administered 2020-05-16: 30 mL

## 2020-05-16 MED ORDER — CHLORHEXIDINE GLUCONATE 0.12 % MT SOLN
OROMUCOSAL | Status: AC
  Administered 2020-05-16: 15 mL via OROMUCOSAL
  Filled 2020-05-16: qty 15

## 2020-05-16 MED ORDER — LIDOCAINE HCL (CARDIAC) PF 100 MG/5ML IV SOSY
PREFILLED_SYRINGE | INTRAVENOUS | Status: DC | PRN
  Administered 2020-05-16: 100 mg via INTRAVENOUS

## 2020-05-16 MED ORDER — ONDANSETRON HCL 4 MG/2ML IJ SOLN
INTRAMUSCULAR | Status: DC | PRN
  Administered 2020-05-16: 4 mg via INTRAVENOUS

## 2020-05-16 MED ORDER — CHLORHEXIDINE GLUCONATE 0.12 % MT SOLN: 15.0000 mL | Freq: Once | OROMUCOSAL | Status: AC

## 2020-05-16 MED ORDER — OXYCODONE HCL 5 MG PO TABS: 5.0000 mg | ORAL_TABLET | Freq: Once | ORAL | Status: DC | PRN

## 2020-05-16 MED ORDER — ACETAMINOPHEN 10 MG/ML IV SOLN
INTRAVENOUS | Status: DC | PRN
  Administered 2020-05-16: 1000 mg via INTRAVENOUS

## 2020-05-16 SURGICAL SUPPLY — 34 items
BLADE SURG 15 STRL SS SAFETY (BLADE) ×3 IMPLANT
CANISTER SUCT 1200ML W/VALVE (MISCELLANEOUS) ×3 IMPLANT
CHLORAPREP W/TINT 26 (MISCELLANEOUS) ×3 IMPLANT
CLOSURE WOUND 1/2 X4 (GAUZE/BANDAGES/DRESSINGS) ×1
COVER WAND RF STERILE (DRAPES) ×3 IMPLANT
DRAPE LAPAROTOMY 100X77 ABD (DRAPES) ×3 IMPLANT
DRSG TEGADERM 4X4.75 (GAUZE/BANDAGES/DRESSINGS) ×3 IMPLANT
DRSG TELFA 4X3 1S NADH ST (GAUZE/BANDAGES/DRESSINGS) ×3 IMPLANT
ELECT REM PT RETURN 9FT ADLT (ELECTROSURGICAL) ×3
ELECTRODE REM PT RTRN 9FT ADLT (ELECTROSURGICAL) ×1 IMPLANT
GLOVE BIO SURGEON STRL SZ7.5 (GLOVE) ×3 IMPLANT
GLOVE INDICATOR 8.0 STRL GRN (GLOVE) ×3 IMPLANT
GOWN STRL REUS W/ TWL LRG LVL3 (GOWN DISPOSABLE) ×2 IMPLANT
GOWN STRL REUS W/TWL LRG LVL3 (GOWN DISPOSABLE) ×6
LABEL OR SOLS (LABEL) ×3 IMPLANT
MESH VENTRALEX ST 8CM LRG (Mesh General) ×3 IMPLANT
NEEDLE HYPO 22GX1.5 SAFETY (NEEDLE) ×6 IMPLANT
NEEDLE HYPO 25X1 1.5 SAFETY (NEEDLE) ×3 IMPLANT
NS IRRIG 500ML POUR BTL (IV SOLUTION) ×3 IMPLANT
PACK BASIN MINOR (MISCELLANEOUS) ×3 IMPLANT
SPONGE LAP 18X18 RF (DISPOSABLE) ×3 IMPLANT
STAPLER SKIN PROX 35W (STAPLE) IMPLANT
STRIP CLOSURE SKIN 1/2X4 (GAUZE/BANDAGES/DRESSINGS) ×2 IMPLANT
SUT SURGILON 0 BLK (SUTURE) ×3 IMPLANT
SUT VIC AB 2-0 BRD 54 (SUTURE) ×3 IMPLANT
SUT VIC AB 2-0 CT1 27 (SUTURE) ×3
SUT VIC AB 2-0 CT1 TAPERPNT 27 (SUTURE) ×1 IMPLANT
SUT VIC AB 3-0 SH 27 (SUTURE) ×3
SUT VIC AB 3-0 SH 27X BRD (SUTURE) ×1 IMPLANT
SUT VIC AB 4-0 FS2 27 (SUTURE) ×3 IMPLANT
SUT VICRYL+ 3-0 144IN (SUTURE) ×3 IMPLANT
SWABSTK COMLB BENZOIN TINCTURE (MISCELLANEOUS) ×3 IMPLANT
SYR 3ML LL SCALE MARK (SYRINGE) ×3 IMPLANT
SYR CONTROL 10ML LL (SYRINGE) ×3 IMPLANT

## 2020-05-16 NOTE — Anesthesia Preprocedure Evaluation (Signed)
Anesthesia Evaluation  Patient identified by MRN, date of birth, ID band Patient awake    Reviewed: Allergy & Precautions, NPO status , Patient's Chart, lab work & pertinent test results  History of Anesthesia Complications Negative for: history of anesthetic complications  Airway Mallampati: II  TM Distance: >3 FB Neck ROM: Full    Dental no notable dental hx. (+) Teeth Intact, Dental Advisory Given   Pulmonary neg pulmonary ROS, neg sleep apnea, neg COPD, Patient abstained from smoking.Not current smoker,    Pulmonary exam normal breath sounds clear to auscultation       Cardiovascular Exercise Tolerance: Good METShypertension, (-) CAD and (-) Past MI (-) dysrhythmias  Rhythm:Regular Rate:Normal - Systolic murmurs    Neuro/Psych negative neurological ROS  negative psych ROS   GI/Hepatic neg GERD  ,(+)     (-) substance abuse  ,   Endo/Other  neg diabetes  Renal/GU negative Renal ROS     Musculoskeletal   Abdominal   Peds  Hematology   Anesthesia Other Findings Past Medical History: No date: Essential hypertension  Reproductive/Obstetrics                             Anesthesia Physical Anesthesia Plan  ASA: II  Anesthesia Plan: General   Post-op Pain Management:    Induction: Intravenous  PONV Risk Score and Plan: 3 and Ondansetron, Dexamethasone and Midazolam  Airway Management Planned: Oral ETT  Additional Equipment: None  Intra-op Plan:   Post-operative Plan: Extubation in OR  Informed Consent: I have reviewed the patients History and Physical, chart, labs and discussed the procedure including the risks, benefits and alternatives for the proposed anesthesia with the patient or authorized representative who has indicated his/her understanding and acceptance.     Dental advisory given  Plan Discussed with: CRNA and Surgeon  Anesthesia Plan Comments: (Discussed  risks of anesthesia with patient, including PONV, sore throat, lip/dental damage. Rare risks discussed as well, such as cardiorespiratory and neurological sequelae. Patient understands.)        Anesthesia Quick Evaluation

## 2020-05-16 NOTE — Transfer of Care (Signed)
Immediate Anesthesia Transfer of Care Note  Patient: Matthew Hill  Procedure(s) Performed: HERNIA REPAIR EPIGASTRIC ADULT WITH MESH (N/A Abdomen)  Patient Location: PACU  Anesthesia Type:General  Level of Consciousness: sedated  Airway & Oxygen Therapy: Patient Spontanous Breathing and Patient connected to face mask oxygen  Post-op Assessment: Report given to RN  Post vital signs: Reviewed and stable  Last Vitals:  Vitals Value Taken Time  BP 99/74 05/16/20 1342  Temp 36.4 C 05/16/20 1327  Pulse 81 05/16/20 1343  Resp 15 05/16/20 1343  SpO2 98 % 05/16/20 1343  Vitals shown include unvalidated device data.  Last Pain:  Vitals:   05/16/20 1327  TempSrc:   PainSc: Asleep         Complications: No complications documented.

## 2020-05-16 NOTE — Op Note (Signed)
Preoperative diagnosis: Epigastric hernia with incarcerated omentum.  Postoperative diagnosis: Same.  Operative procedure: Repair of epigastric hernia with 8 cm secure strap mesh.  Operating surgeon: Donnalee Curry, MD.  Anesthesia: General by LMA, Marcaine 0.5% with 1: 200,000 units of epinephrine.  Estimated blood loss: Less than 5 cc.  Clinical note: This 64 year old male is developed increasing enlarging epigastric hernia.  He was admitted for elective repair.  Operative note: With the patient under adequate general anesthesia the abdomen was cleansed with ChloraPrep and draped.  A transverse incision was made with the scalpel over the previously marked hernia site.  Hemostasis was electrocautery.  The hernia sac was freed circumferentially and transected at the fascial level.  This was excised and discarded.  The omental contents were returned to the abdominal cavity.  The undersurface of the fascia was cleared and an 8 cm secure strap mesh smoothed into position.  The 2.5 cm hernia defect was closed with interrupted 0 Surgilon sutures placed under direct vision.  The mesh was grasped with each bite of the fascial suture.  Adipose tissue was closed in 2 layers with a running 2-0 Vicryl suture obliterating dead space.  The skin was closed with a running 4-0 Vicryl subcuticular suture.  Benzoin, Steri-Strips, Telfa and Tegaderm dressing applied.  The patient tolerated the procedure well and was taken to recovery room in stable condition.

## 2020-05-16 NOTE — Anesthesia Procedure Notes (Signed)
Procedure Name: LMA Insertion Date/Time: 05/16/2020 12:45 PM Performed by: Almeta Monas, CRNA Pre-anesthesia Checklist: Patient identified, Patient being monitored, Timeout performed, Emergency Drugs available and Suction available Patient Re-evaluated:Patient Re-evaluated prior to induction Oxygen Delivery Method: Circle system utilized Preoxygenation: Pre-oxygenation with 100% oxygen Induction Type: IV induction Ventilation: Mask ventilation without difficulty LMA: LMA inserted LMA Size: 4.5 Tube type: Oral Number of attempts: 1 Placement Confirmation: positive ETCO2 and breath sounds checked- equal and bilateral Tube secured with: Tape Dental Injury: Teeth and Oropharynx as per pre-operative assessment

## 2020-05-16 NOTE — H&P (Signed)
Matthew Hill [id number] [birth year]     HPI:  64 y/o male with a symptomatic epigastric hernia. For repair. Possibility of mesh placement reviewed.   Medications Prior to Admission  Medication Sig Dispense Refill Last Dose  . amLODipine (NORVASC) 5 MG tablet Take 1 tablet (5 mg total) by mouth daily. 90 tablet 3 05/16/2020 at Unknown time   No Known Allergies Past Medical History:  Diagnosis Date  . Essential hypertension    Past Surgical History:  Procedure Laterality Date  . FRACTURE SURGERY     rods and screws in left    Social History   Socioeconomic History  . Marital status: Single    Spouse name: Not on file  . Number of children: Not on file  . Years of education: Not on file  . Highest education level: Not on file  Occupational History  . Not on file  Tobacco Use  . Smoking status: Never Smoker  . Smokeless tobacco: Never Used  Vaping Use  . Vaping Use: Never used  Substance and Sexual Activity  . Alcohol use: Yes    Alcohol/week: 1.0 standard drink    Types: 1 Glasses of wine per week    Comment: occasional  . Drug use: No  . Sexual activity: Not Currently  Other Topics Concern  . Not on file  Social History Narrative  . Not on file   Social Determinants of Health   Financial Resource Strain: Low Risk   . Difficulty of Paying Living Expenses: Not hard at all  Food Insecurity: No Food Insecurity  . Worried About Programme researcher, broadcasting/film/video in the Last Year: Never true  . Ran Out of Food in the Last Year: Never true  Transportation Needs: No Transportation Needs  . Lack of Transportation (Medical): No  . Lack of Transportation (Non-Medical): No  Physical Activity: Insufficiently Active  . Days of Exercise per Week: 3 days  . Minutes of Exercise per Session: 30 min  Stress: No Stress Concern Present  . Feeling of Stress : Only a little  Social Connections: Unknown  . Frequency of Communication with Friends and Family: Three times a week  . Frequency of  Social Gatherings with Friends and Family: Twice a week  . Attends Religious Services: Patient refused  . Active Member of Clubs or Organizations: Patient refused  . Attends Banker Meetings: Patient refused  . Marital Status: Patient refused  Intimate Partner Violence: Not At Risk  . Fear of Current or Ex-Partner: No  . Emotionally Abused: No  . Physically Abused: No  . Sexually Abused: No   Social History   Social History Narrative  . Not on file     ROS: Negative.     PE: HEENT: Negative. Lungs: Clear. Cardio: RR.    Assessment/Plan:  Proceed with planned epigastric hernia repair.    Merrily Pew [facility] 05/16/2020

## 2020-05-16 NOTE — Anesthesia Postprocedure Evaluation (Signed)
Anesthesia Post Note  Patient: Matthew Hill  Procedure(s) Performed: HERNIA REPAIR EPIGASTRIC ADULT WITH MESH (N/A Abdomen)  Patient location during evaluation: PACU Anesthesia Type: General Level of consciousness: awake and alert and oriented Pain management: pain level controlled Vital Signs Assessment: post-procedure vital signs reviewed and stable Respiratory status: spontaneous breathing, nonlabored ventilation and respiratory function stable Cardiovascular status: blood pressure returned to baseline and stable Postop Assessment: no signs of nausea or vomiting Anesthetic complications: no   No complications documented.   Last Vitals:  Vitals:   05/16/20 1435 05/16/20 1458  BP:  (!) 154/103  Pulse: 69 69  Resp: 15 20  Temp: (!) 36.3 C (!) 36.1 C  SpO2: 95% 97%    Last Pain:  Vitals:   05/16/20 1458  TempSrc: Temporal  PainSc: 0-No pain                 Nova Schmuhl

## 2020-05-16 NOTE — Discharge Instructions (Signed)

## 2020-05-18 ENCOUNTER — Encounter: Payer: Self-pay | Admitting: General Surgery

## 2022-07-17 ENCOUNTER — Encounter: Payer: Self-pay | Admitting: Emergency Medicine

## 2022-07-17 ENCOUNTER — Other Ambulatory Visit: Payer: Self-pay

## 2022-07-17 ENCOUNTER — Emergency Department: Payer: Self-pay

## 2022-07-17 ENCOUNTER — Emergency Department
Admission: EM | Admit: 2022-07-17 | Discharge: 2022-07-18 | Disposition: A | Discharge status: Home / Self Care | Payer: Self-pay | Attending: Emergency Medicine | Admitting: Emergency Medicine

## 2022-07-17 DIAGNOSIS — R791 Abnormal coagulation profile: Secondary | ICD-10-CM | POA: Insufficient documentation

## 2022-07-17 DIAGNOSIS — I10 Essential (primary) hypertension: Secondary | ICD-10-CM | POA: Insufficient documentation

## 2022-07-17 DIAGNOSIS — R0789 Other chest pain: Secondary | ICD-10-CM | POA: Insufficient documentation

## 2022-07-17 LAB — D-DIMER, QUANTITATIVE: D-Dimer, Quant: 0.27 ug/mL-FEU (ref 0.00–0.50)

## 2022-07-17 LAB — TROPONIN I (HIGH SENSITIVITY)
Troponin I (High Sensitivity): 3 ng/L (ref <18)
Troponin I (High Sensitivity): 6 ng/L (ref <18)

## 2022-07-17 LAB — BASIC METABOLIC PANEL
Anion gap: 10 (ref 5–15)
BUN: 14 mg/dL (ref 8–23)
CO2: 21 mmol/L — ABNORMAL LOW (ref 22–32)
Calcium: 8.5 mg/dL — ABNORMAL LOW (ref 8.9–10.3)
Chloride: 109 mmol/L (ref 98–111)
Creatinine, Ser: 0.96 mg/dL (ref 0.61–1.24)
GFR, Estimated: >60 mL/min (ref >60)
Glucose, Bld: 148 mg/dL — ABNORMAL HIGH (ref 70–99)
Potassium: 3.3 mmol/L — ABNORMAL LOW (ref 3.5–5.1)
Sodium: 140 mmol/L (ref 135–145)

## 2022-07-17 LAB — CBC
HCT: 39.6 % (ref 39.0–52.0)
Hemoglobin: 12.3 g/dL — ABNORMAL LOW (ref 13.0–17.0)
MCH: 23.7 pg — ABNORMAL LOW (ref 26.0–34.0)
MCHC: 31.1 g/dL (ref 30.0–36.0)
MCV: 76.4 fL — ABNORMAL LOW (ref 80.0–100.0)
Platelets: 290 10*3/uL (ref 150–400)
RBC: 5.18 MIL/uL (ref 4.22–5.81)
RDW: 14 % (ref 11.5–15.5)
WBC: 8 10*3/uL (ref 4.0–10.5)
nRBC: 0 % (ref 0.0–0.2)

## 2022-07-17 NOTE — ED Provider Notes (Signed)
[facility] Provider Note    Event Date/Time   First MD Initiated Contact with Patient 07/17/22 2033     (approximate)   History   Chest Pain   HPI { Kennie Snedden is a 66 y.o. male had pain in the left lower chest that came on felt like gas.  It lasted a period of time it went away while he was walking around.  He said walking around the house made it feel better.  He has no pain now.  He is not remember EMS giving him any nitro pills.  Blood pressure somewhat low today in the 90s.  Patient reportedly had hypertension was taking amlodipine but has not had any further quite some time. Patient had no shortness of breath with this and no pleuritic pain with this.  Physical Exam   Triage Vital Signs: ED Triage Vitals  Enc Vitals Group     BP 07/17/22 2016 (!) 64/45     Pulse Rate 07/17/22 2016 77     Resp 07/17/22 2025 18     Temp 07/17/22 2016 98.2 F (36.8 C)     Temp Source 07/17/22 2016 Oral     SpO2 07/17/22 2016 95 %     Weight 07/17/22 2010 178 lb (80.7 kg)     Height 07/17/22 2010 5\' 6"  (1.676 m)     Head Circumference --      Peak Flow --      Pain Score 07/17/22 2017 0     Pain Loc --      Pain Edu? --      Excl. in GC? --     Most recent vital signs: Vitals:   07/17/22 2230 07/17/22 2300  BP: (!) 120/92 (!) 121/93  Pulse: 89 89  Resp: 13 16  Temp:    SpO2: 95% 95%     General: Awake, no distress.  CV:  Good peripheral perfusion.  Heart regular rate and rhythm no audible murmurs Resp:  Normal effort.  Lungs are clear Chest with no chest wall tenderness Abd:  No distention.  Soft and nontender there is a well-healed surgical scar from repair of his ventral hernia. Extremities no edema   ED Results / Procedures / Treatments   Labs (all labs ordered are listed, but only abnormal results are displayed) Labs Reviewed  BASIC METABOLIC PANEL - Abnormal; Notable for the following components:      Result Value   Potassium  3.3 (*)    CO2 21 (*)    Glucose, Bld 148 (*)    Calcium 8.5 (*)    All other components within normal limits  CBC - Abnormal; Notable for the following components:   Hemoglobin 12.3 (*)    MCV 76.4 (*)    MCH 23.7 (*)    All other components within normal limits  D-DIMER, QUANTITATIVE  TROPONIN I (HIGH SENSITIVITY)  TROPONIN I (HIGH SENSITIVITY)     EKG  EKG read and interpreted by me shows normal sinus rhythm rate of 81 normal axis flipped T's inferiorly in the lateral chest leads.  Previous EKG from 2 years ago showed minimal flipped T's inferiorly and none in the chest leads.  The leads show much more deeply inverted T waves now. EKG #2 shows normal sinus rhythm rate of 94 no change from EKG #1.  Both were done without pain from what the patient was able to tell me.  RADIOLOGY Chest x-ray read by radiology reviewed and interpreted  by me shows no acute disease   PROCEDURES:  Critical Care performed:   Procedures   MEDICATIONS ORDERED IN ED: Medications - No data to display   IMPRESSION / MDM / ASSESSMENT AND PLAN / ED COURSE  I reviewed the triage vital signs and the nursing notes. Review of patient's old records shows blood pressures been considerably higher in the past.  Patient is completely asymptomatic now.  I am uncertain if this is due to a blood pressure cuff position or what we will check and see. D-dimer is pending currently.  I will sign the patient out to oncoming physician.  Plan is to discharge with cardiology follow-up if the D-dimer is negative.  The D-dimer is positive he will need a chest CT.  Patient's presentation is most consistent with acute complicated illness / injury requiring diagnostic workup.  {The patient is on the cardiac monitor to evaluate for evidence of arrhythmia and/or significant heart rate changes.  None have been seen   FINAL CLINICAL IMPRESSION(S) / ED DIAGNOSES   Final diagnoses:  Atypical chest pain     Rx / DC Orders    ED Discharge Orders     None        Note:  This document was prepared using Dragon voice recognition software and may include unintentional dictation errors.   Arnaldo Natal, MD 07/17/22 [phone]

## 2022-07-17 NOTE — ED Triage Notes (Signed)
Pt reports left sided chest pain developed at 6pm while watching TV, denies any other symptoms. Pt talks in complete sentences no distress noted

## 2022-07-17 NOTE — ED Notes (Signed)
Pt A&Ox4. Pt states the pain started on right side and came right in. Pt denies pain at this time. Pt said it felt like gas. Walking around makes the discomfort better. Pt keeps eyes closed the entire time talking.

## 2022-07-17 NOTE — Discharge Instructions (Addendum)
The tests we have done today are all negative for anything going on with your heart.  Just in case I want you to follow-up with cardiology.  Dr. Azucena Cecil is on-call.  He is very good.  If you give the office a call in the morning they should be out of work you in fairly quickly.  Please return here for any further problems.

## 2022-07-17 NOTE — ED Triage Notes (Signed)
FIRST NURSE NOTE:  Pt arrived via [facility] from home with reports of chest pressure, feels like a gas pain  146/98  Hx of HTN hasn't been on meds for over a year.  CBG 126  20G L FA  324 ASA given with EMS.

## 2023-01-23 ENCOUNTER — Other Ambulatory Visit: Payer: Self-pay

## 2023-01-23 ENCOUNTER — Emergency Department (HOSPITAL_COMMUNITY): Payer: Medicare Other

## 2023-01-23 ENCOUNTER — Emergency Department (HOSPITAL_COMMUNITY)
Admission: EM | Admit: 2023-01-23 | Discharge: 2023-01-23 | Disposition: A | Discharge status: Home / Self Care | Payer: Medicare Other | Attending: Emergency Medicine | Admitting: Emergency Medicine

## 2023-01-23 DIAGNOSIS — T189XXA Foreign body of alimentary tract, part unspecified, initial encounter: Secondary | ICD-10-CM | POA: Diagnosis not present

## 2023-01-23 DIAGNOSIS — I10 Essential (primary) hypertension: Secondary | ICD-10-CM | POA: Diagnosis not present

## 2023-01-23 DIAGNOSIS — R002 Palpitations: Secondary | ICD-10-CM | POA: Insufficient documentation

## 2023-01-23 DIAGNOSIS — Z79899 Other long term (current) drug therapy: Secondary | ICD-10-CM | POA: Insufficient documentation

## 2023-01-23 DIAGNOSIS — W449XXA Unspecified foreign body entering into or through a natural orifice, initial encounter: Secondary | ICD-10-CM | POA: Insufficient documentation

## 2023-01-23 LAB — CBC
HCT: 41.2 % (ref 39.0–52.0)
Hemoglobin: 12.8 g/dL — ABNORMAL LOW (ref 13.0–17.0)
MCH: 24.1 pg — ABNORMAL LOW (ref 26.0–34.0)
MCHC: 31.1 g/dL (ref 30.0–36.0)
MCV: 77.4 fL — ABNORMAL LOW (ref 80.0–100.0)
Platelets: 413 10*3/uL — ABNORMAL HIGH (ref 150–400)
RBC: 5.32 MIL/uL (ref 4.22–5.81)
RDW: 14.6 % (ref 11.5–15.5)
WBC: 9.7 10*3/uL (ref 4.0–10.5)
nRBC: 0 % (ref 0.0–0.2)

## 2023-01-23 LAB — BASIC METABOLIC PANEL
Anion gap: 13 (ref 5–15)
BUN: 12 mg/dL (ref 8–23)
CO2: 18 mmol/L — ABNORMAL LOW (ref 22–32)
Calcium: 8.2 mg/dL — ABNORMAL LOW (ref 8.9–10.3)
Chloride: 110 mmol/L (ref 98–111)
Creatinine, Ser: 0.79 mg/dL (ref 0.61–1.24)
GFR, Estimated: >60 mL/min (ref >60)
Glucose, Bld: 98 mg/dL (ref 70–99)
Potassium: 4.5 mmol/L (ref 3.5–5.1)
Sodium: 141 mmol/L (ref 135–145)

## 2023-01-23 MED ORDER — SODIUM CHLORIDE 0.9 % IV BOLUS
500.0000 mL | Freq: Once | INTRAVENOUS | Status: AC
  Administered 2023-01-23: 500 mL via INTRAVENOUS

## 2023-01-23 MED ORDER — AMLODIPINE BESYLATE 5 MG PO TABS
5.0000 mg | ORAL_TABLET | Freq: Once | ORAL | Status: AC
  Administered 2023-01-23: 5 mg via ORAL
  Filled 2023-01-23: qty 1

## 2023-01-23 NOTE — ED Triage Notes (Addendum)
Patient BIB EMS come from home with c/o after a few drinks and CBD oil in drink started to feel palpitation, n/v. Denies chest pain, SOB. HR 114, 208/140, 16, 98%ra.

## 2023-01-23 NOTE — ED Notes (Signed)
Pt was able to walk to bathroom without any assistance
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
## 2023-01-23 NOTE — Discharge Instructions (Signed)
As we discussed, your workup in the ER today was reassuring for acute findings.  X-ray imaging and laboratory evaluation did not reveal any emergent concerns.  It is very important that you take your blood pressure medication every day and check it at home to ensure normal readings.  Suspect your symptoms today were likely due to the substance you ingested earlier today.  I recommend that you avoid this in the future.  Follow-up with your primary care doctor as needed.  Return if development of any new or worsening symptoms.

## 2023-05-23 ENCOUNTER — Ambulatory Visit: Payer: No Typology Code available for payment source
